# Patient Record
Sex: Male | Born: 1965 | Race: White | Hispanic: No | Marital: Married
Health system: Southern US, Community
[De-identification: ages and names within clinical notes are randomized; demographics above are authoritative.]

---

## 2009-07-19 ENCOUNTER — Observation Stay (HOSPITAL_COMMUNITY): Admission: EM | Admit: 2009-07-19 | Discharge: 2009-07-19 | Payer: Self-pay | Admitting: Emergency Medicine

## 2009-09-07 ENCOUNTER — Ambulatory Visit: Payer: Self-pay | Admitting: Cardiology

## 2009-09-07 ENCOUNTER — Encounter (INDEPENDENT_AMBULATORY_CARE_PROVIDER_SITE_OTHER): Payer: Self-pay | Admitting: Diagnostic Neuroimaging

## 2009-09-07 ENCOUNTER — Ambulatory Visit (HOSPITAL_COMMUNITY): Admission: RE | Admit: 2009-09-07 | Discharge: 2009-09-07 | Payer: Self-pay | Admitting: Diagnostic Neuroimaging

## 2009-09-07 ENCOUNTER — Ambulatory Visit: Payer: Self-pay

## 2010-02-13 IMAGING — CR DG CHEST 2V
2 series · 2 of 2 positions shown · non-contrast
Comparison: None available.

CLINICAL DATA: Weakness.  Near syncope.

CHEST - 2 VIEW

[w chest pa]
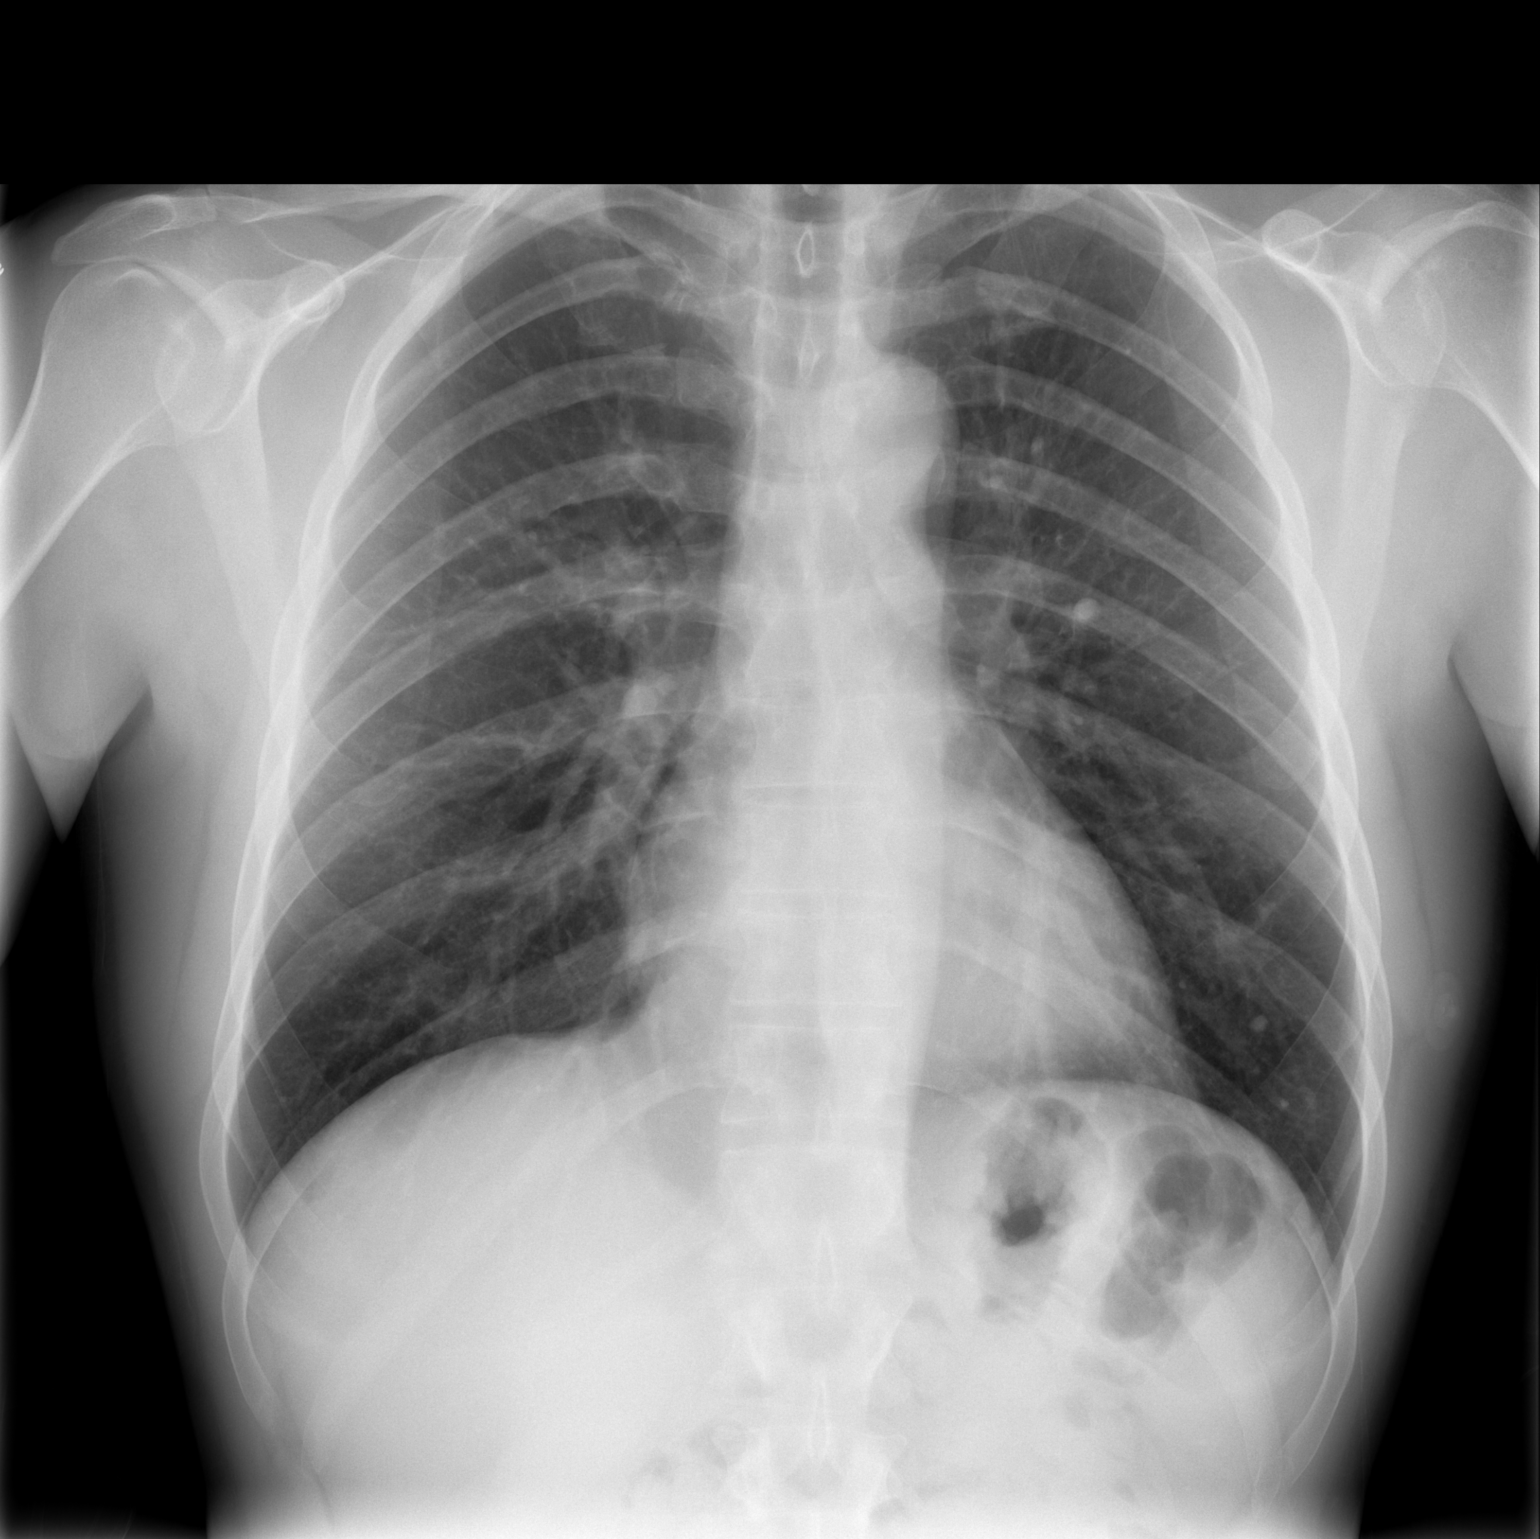

[w chest lat]
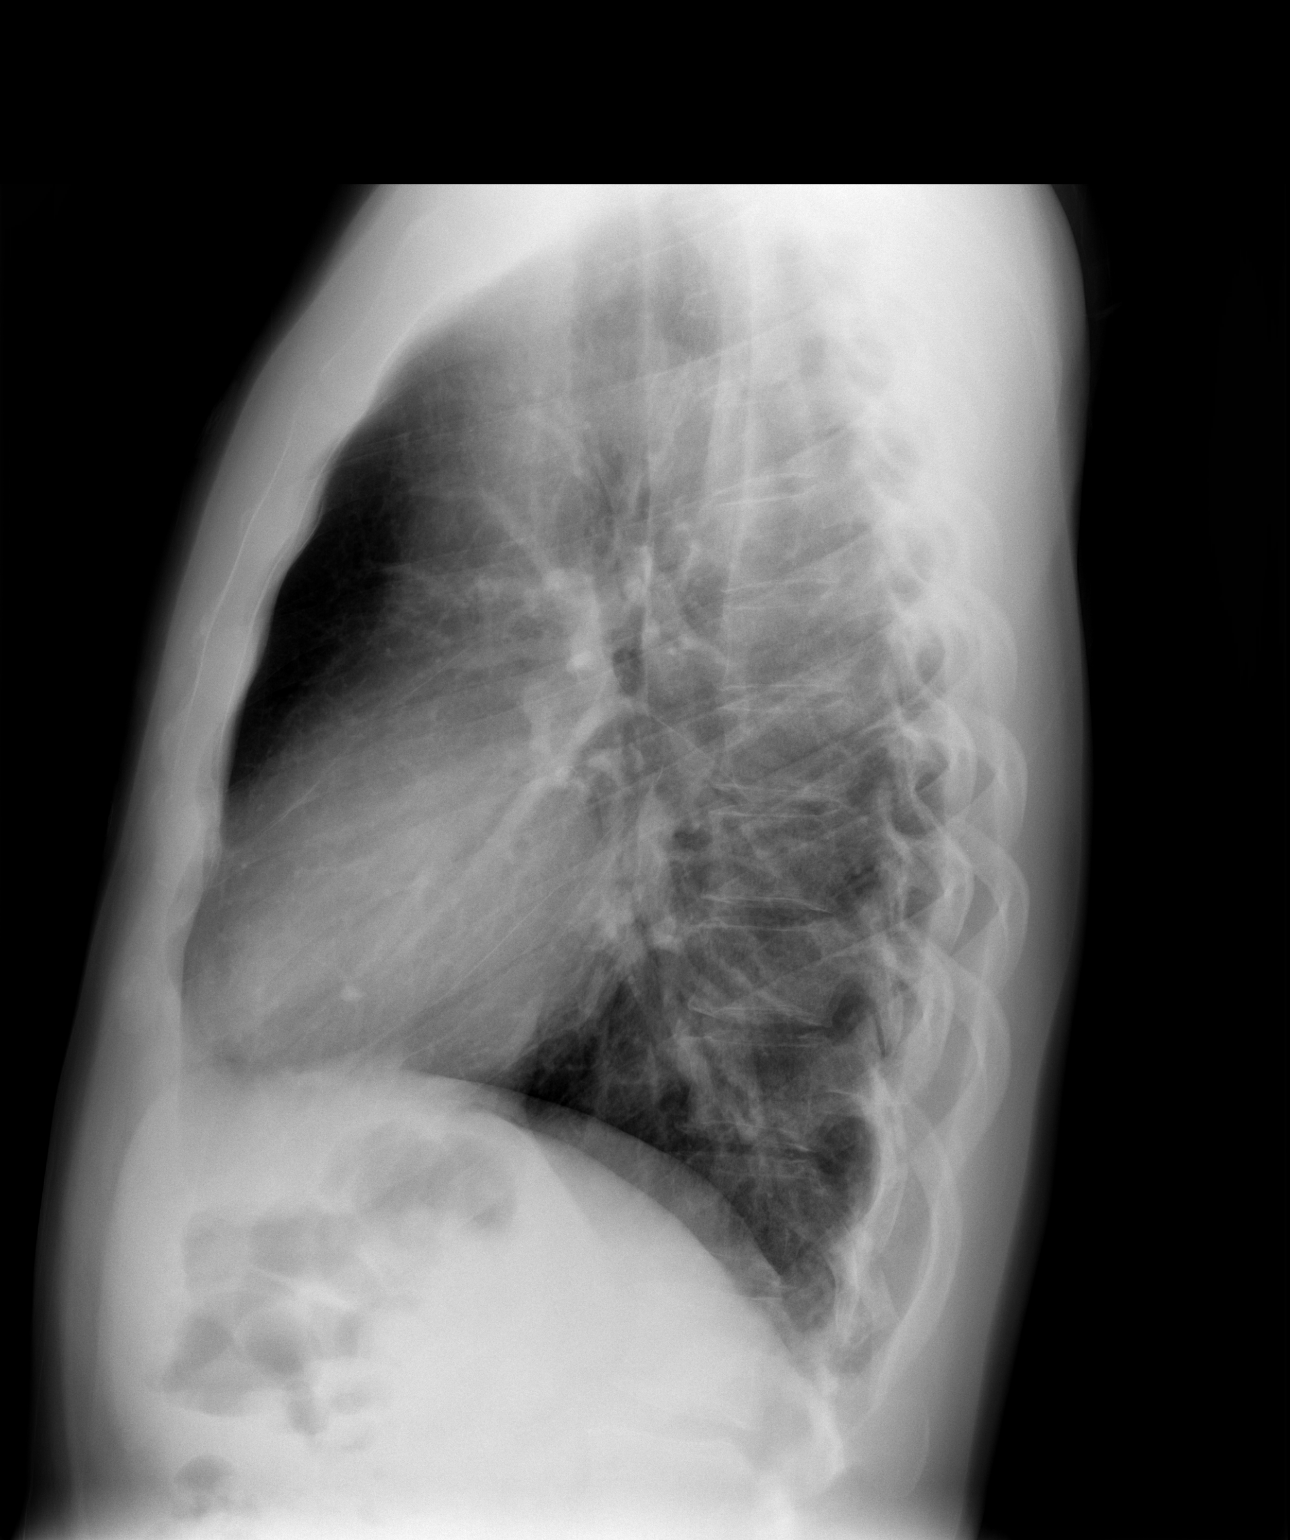

[2 of 2 positions shown; findings below may reference images not displayed]

FINDINGS: The cardiopericardial silhouette is within normal limits
for size.  There is a well-defined density within the lingula.
This likely represents a focal granuloma.  No other focal nodular
lesions are present.  The lung fields are clear.  The visualized
soft tissues and bony thorax are unremarkable.
IMPRESSION: 1.  Hyperdense sub centimeter nodule in the lingula is most
compatible with a granuloma.  Comparison films would be useful to
assure stability of this lesion.  If no films are available, a
repeat chest radiograph at 6 months is recommended.
2.  No acute cardiopulmonary disease.

## 2010-02-13 IMAGING — CT CT HEAD W/O CM
1 of 2 series · 13 of 30 positions shown, 17 images · non-contrast
Comparison: None

CLINICAL DATA: Near syncope.

CT HEAD WITHOUT CONTRAST
TECHNIQUE: Contiguous axial images were obtained from the base of
the skull through the vertex without contrast

[Series 2: brain · axial · 0.49mm/px · z∈[-119,+17]mm · 13 of 36 slices shown, 17 images]
[im 3/36  brain]
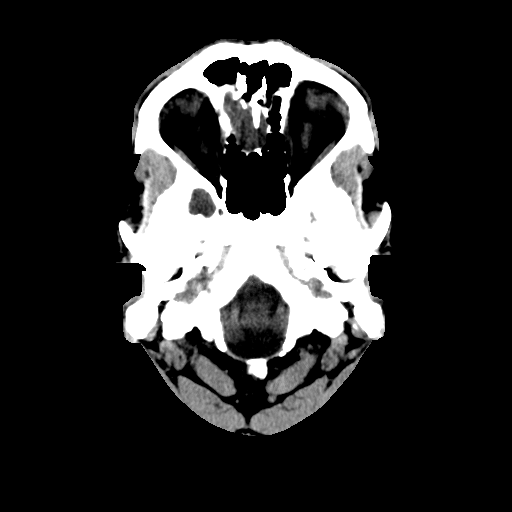
[im 3/36  bone]
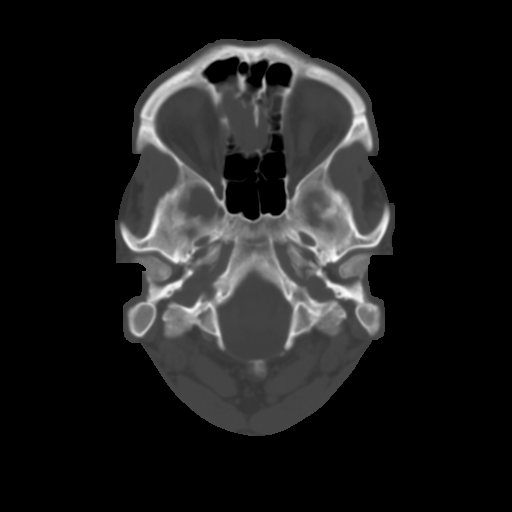
[im 6/36  brain]
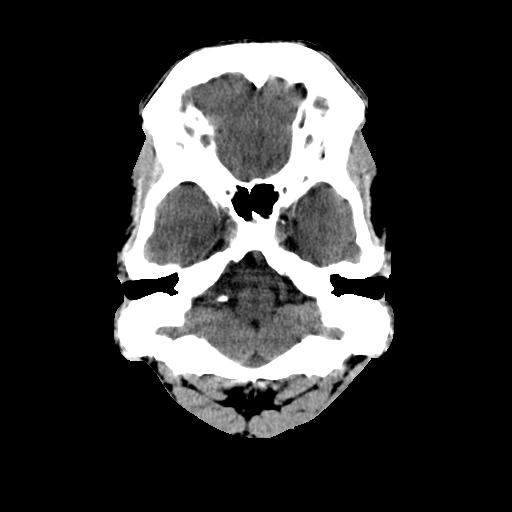
[im 8/36  brain]
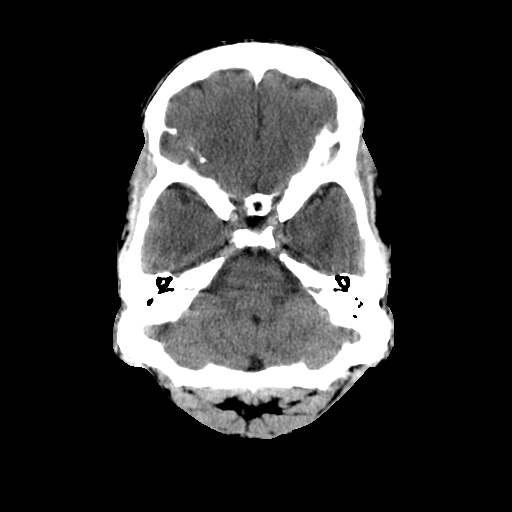
[im 11/36  brain]
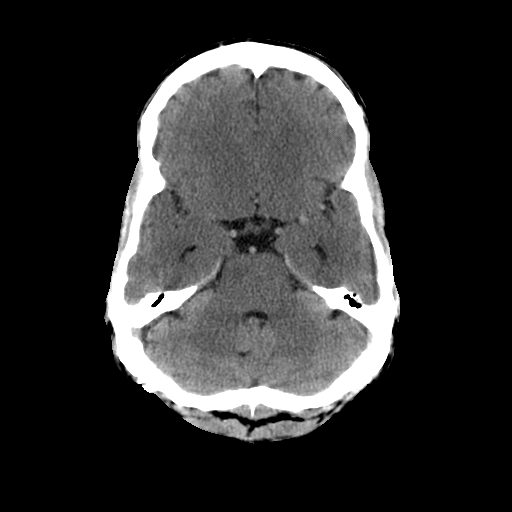
[im 13/36  brain]
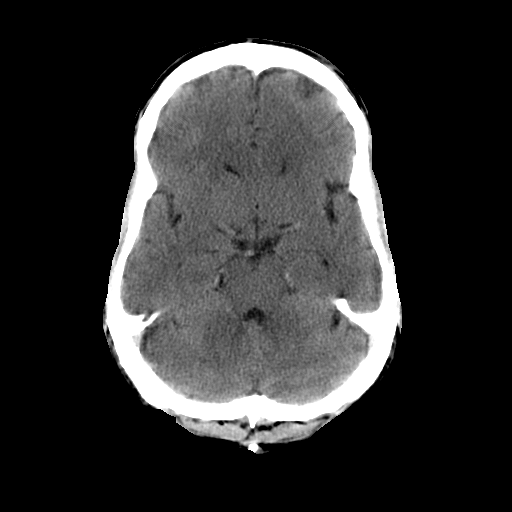
[im 13/36  bone]
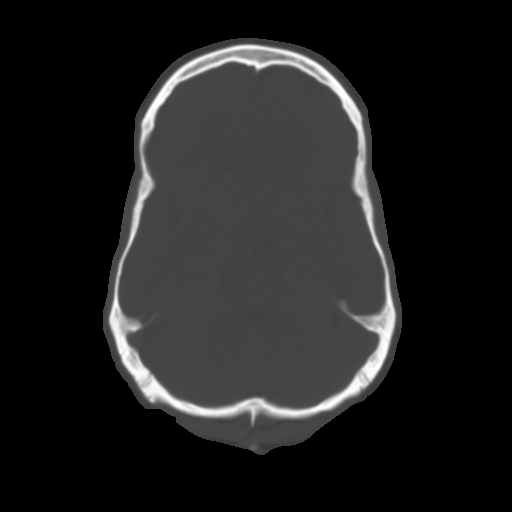
[im 16/36  brain]
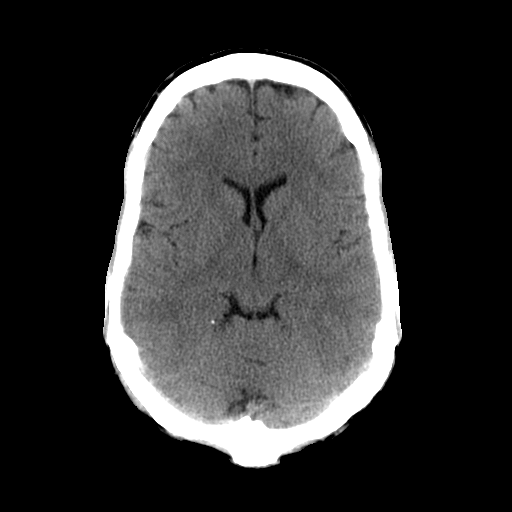
[im 18/36  brain]
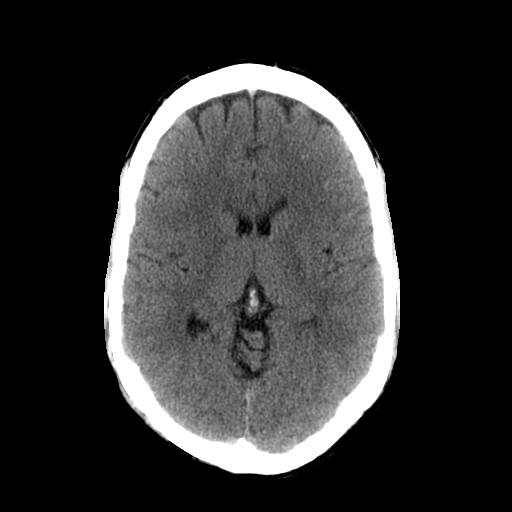
[im 21/36  brain]
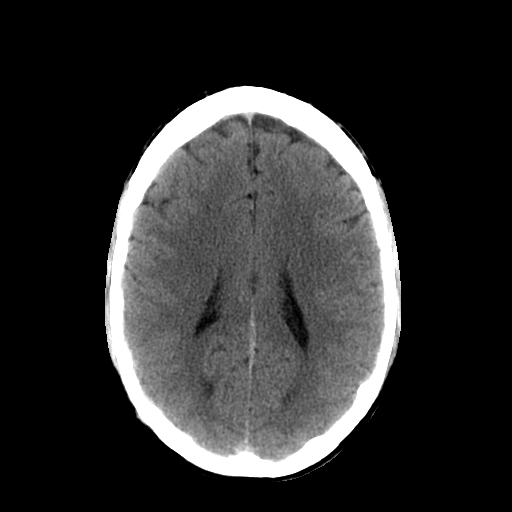
[im 23/36  brain]
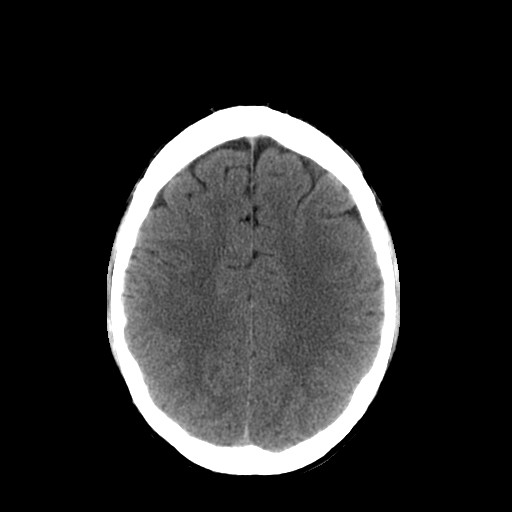
[im 23/36  bone]
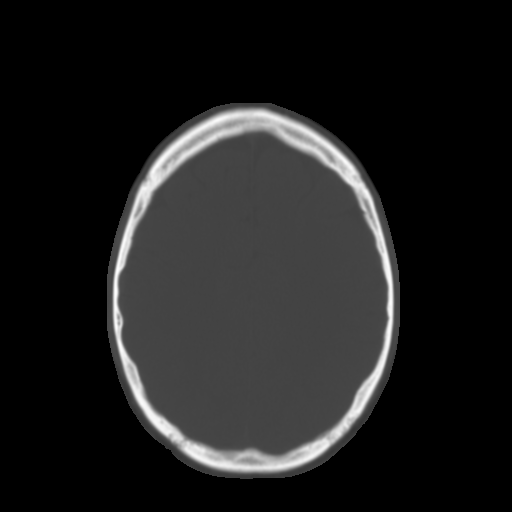
[im 26/36  brain]
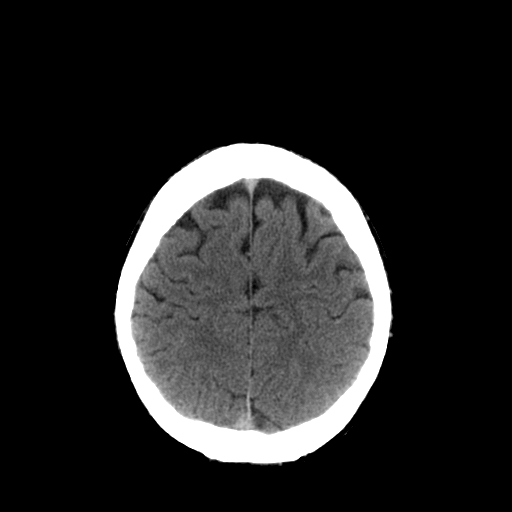
[im 28/36  brain]
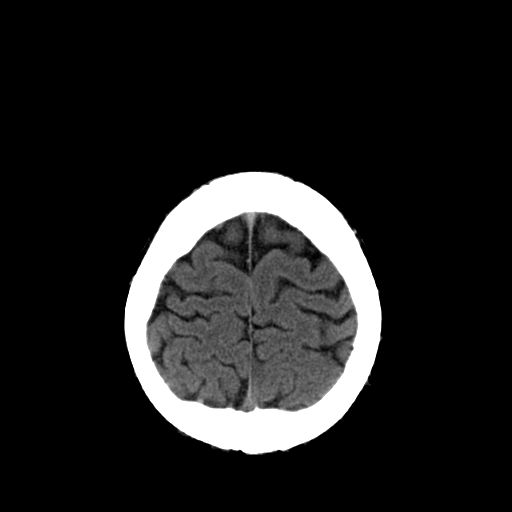
[im 31/36  brain]
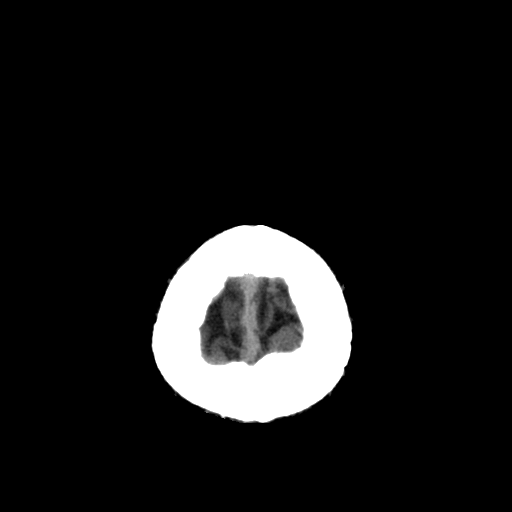
[im 33/36  brain]
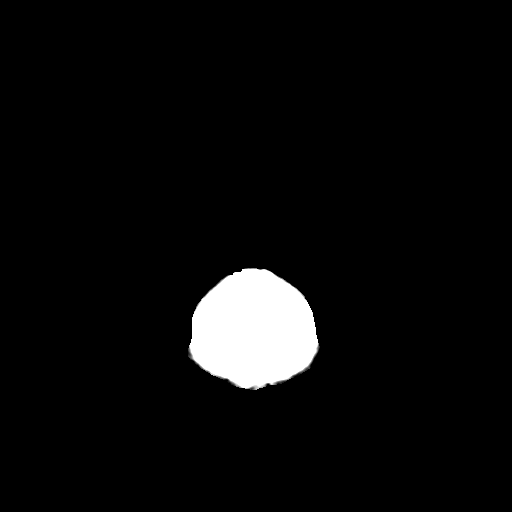
[im 33/36  bone]
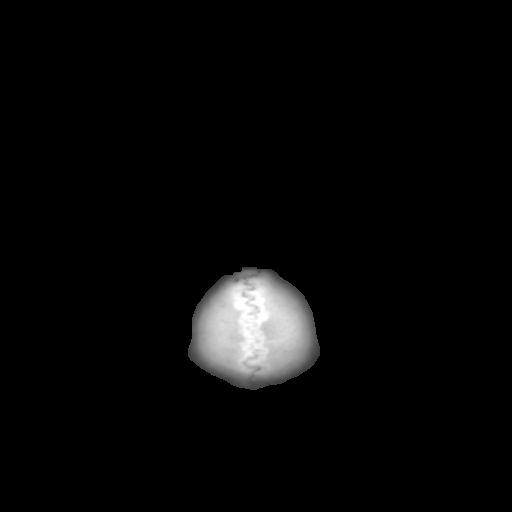

[13 of 30 positions shown; findings below may reference images not displayed]

FINDINGS: The brain has a normal appearance without evidence for
hemorrhage, acute infarction, hydrocephalus, or mass lesion.  There
is no extra axial fluid collection.  The skull and paranasal
sinuses are normal.
IMPRESSION: Normal CT of the head without contrast.

## 2011-02-01 LAB — DIFFERENTIAL
Basophils Relative: 0 % (ref 0–1)
Eosinophils Absolute: 0 10*3/uL (ref 0.0–0.7)
Monocytes Absolute: 0.4 10*3/uL (ref 0.1–1.0)
Monocytes Relative: 7 % (ref 3–12)

## 2011-02-01 LAB — BASIC METABOLIC PANEL
CO2: 24 mEq/L (ref 19–32)
Chloride: 99 mEq/L (ref 96–112)
GFR calc Af Amer: >60 mL/min (ref >60)
Potassium: 3.7 mEq/L (ref 3.5–5.1)
Sodium: 131 mEq/L — ABNORMAL LOW (ref 135–145)

## 2011-02-01 LAB — CBC
Hemoglobin: 14.8 g/dL (ref 13.0–17.0)
MCHC: 35.4 g/dL (ref 30.0–36.0)
MCV: 89.9 fL (ref 78.0–100.0)
RBC: 4.64 MIL/uL (ref 4.22–5.81)

## 2019-08-25 ENCOUNTER — Encounter: Payer: Self-pay | Admitting: *Deleted

## 2022-01-17 ENCOUNTER — Ambulatory Visit: Payer: BC Managed Care – PPO | Admitting: Physician Assistant

## 2022-01-17 ENCOUNTER — Other Ambulatory Visit: Payer: Self-pay

## 2022-01-17 ENCOUNTER — Encounter: Payer: Self-pay | Admitting: Physician Assistant

## 2022-01-17 DIAGNOSIS — L820 Inflamed seborrheic keratosis: Secondary | ICD-10-CM

## 2022-01-17 DIAGNOSIS — Z808 Family history of malignant neoplasm of other organs or systems: Secondary | ICD-10-CM

## 2022-01-17 DIAGNOSIS — L570 Actinic keratosis: Secondary | ICD-10-CM

## 2022-01-17 DIAGNOSIS — Z1283 Encounter for screening for malignant neoplasm of skin: Secondary | ICD-10-CM

## 2022-02-07 ENCOUNTER — Encounter: Payer: Self-pay | Admitting: Physician Assistant

## 2022-02-07 NOTE — Progress Notes (Signed)
? ?  New Patient ?  ?Subjective  ?Jesse Pena is a 56 y.o. male who presents for the following: Annual Exam (Lesions on back since birth- one of them is peeling & scaly. No personal history of melanoma or non mole skin cancers. Family history of non mole skin cancer but no melanoma. ). ? ? ?The following portions of the chart were reviewed this encounter and updated as appropriate:  Tobacco  Allergies  Meds  Problems  Med Hx  Surg Hx  Fam Hx   ?  ? ?Objective  ?Well appearing patient in no apparent distress; mood and affect are within normal limits. ? ?A full examination was performed including scalp, head, eyes, ears, nose, lips, neck, chest, axillae, abdomen, back, buttocks, bilateral upper extremities, bilateral lower extremities, hands, feet, fingers, toes, fingernails, and toenails. All findings within normal limits unless otherwise noted below. ? ?waist up ?No atypical nevi No signs of non-mole skin cancer.  ? ?Dorsum of Nose (2) ?Erythematous patches with gritty scale. ? ?Left Lower Back ?Dark crusted plaque with erythematous base.   ? ? ?Assessment & Plan  ?AK (actinic keratosis) (2) ?Dorsum of Nose ? ?Destruction of lesion - Dorsum of Nose ?Complexity: simple   ?Destruction method: cryotherapy   ?Informed consent: discussed and consent obtained   ?Timeout:  patient name, date of birth, surgical site, and procedure verified ?Lesion destroyed using liquid nitrogen: Yes   ?Cryotherapy cycles:  3 ?Outcome: patient tolerated procedure well with no complications   ? ?Seborrheic keratosis, inflamed ?Left Lower Back ? ?Destruction of lesion - Left Lower Back ?Complexity: simple   ?Destruction method: cryotherapy   ?Informed consent: discussed and consent obtained   ?Timeout:  patient name, date of birth, surgical site, and procedure verified ?Lesion destroyed using liquid nitrogen: Yes   ?Cryotherapy cycles:  3 ?Outcome: patient tolerated procedure well with no complications   ? ?Encounter for screening  for malignant neoplasm of skin ?waist up ? ?Yearly skin examinations.  ? ? ? ? ?I, Jakeim Sedore, PA-C, have reviewed all documentation's for this visit.  The documentation on 02/07/22 for the exam, diagnosis, procedures and orders are all accurate and complete. ?

## 2024-01-23 ENCOUNTER — Other Ambulatory Visit: Payer: Self-pay | Admitting: Urology

## 2024-01-23 DIAGNOSIS — R972 Elevated prostate specific antigen [PSA]: Secondary | ICD-10-CM

## 2024-02-03 ENCOUNTER — Encounter: Payer: Self-pay | Admitting: Urology

## 2024-02-06 ENCOUNTER — Encounter: Payer: Self-pay | Admitting: Urology
